# Patient Record
Sex: Female | Born: 1945 | Race: White | Hispanic: No | Marital: Married | Smoking: Former smoker
Health system: Southern US, Community
[De-identification: ages and names within clinical notes are randomized; demographics above are authoritative.]

## PROBLEM LIST (undated history)

## (undated) DIAGNOSIS — E785 Hyperlipidemia, unspecified: Secondary | ICD-10-CM

## (undated) DIAGNOSIS — R519 Headache, unspecified: Secondary | ICD-10-CM

## (undated) DIAGNOSIS — I10 Essential (primary) hypertension: Secondary | ICD-10-CM

## (undated) HISTORY — PX: FOOT SURGERY: SHX648

---

## 2007-03-18 ENCOUNTER — Encounter: Admission: RE | Admit: 2007-03-18 | Discharge: 2007-03-18 | Payer: Self-pay | Admitting: Specialist

## 2014-03-03 ENCOUNTER — Other Ambulatory Visit: Payer: Self-pay | Admitting: Adult Health

## 2014-03-03 ENCOUNTER — Ambulatory Visit (INDEPENDENT_AMBULATORY_CARE_PROVIDER_SITE_OTHER): Payer: Self-pay

## 2014-03-03 DIAGNOSIS — T1490XA Injury, unspecified, initial encounter: Secondary | ICD-10-CM

## 2014-03-03 DIAGNOSIS — M25529 Pain in unspecified elbow: Secondary | ICD-10-CM

## 2014-03-11 ENCOUNTER — Encounter: Payer: Self-pay | Admitting: Sports Medicine

## 2014-03-22 ENCOUNTER — Ambulatory Visit (INDEPENDENT_AMBULATORY_CARE_PROVIDER_SITE_OTHER): Payer: Worker's Compensation | Admitting: Sports Medicine

## 2014-03-22 ENCOUNTER — Encounter: Payer: Self-pay | Admitting: Sports Medicine

## 2014-03-22 VITALS — BP 114/70 | HR 74 | Ht 64.0 in | Wt 160.0 lb

## 2014-03-22 DIAGNOSIS — M25529 Pain in unspecified elbow: Secondary | ICD-10-CM

## 2014-03-22 DIAGNOSIS — M25521 Pain in right elbow: Secondary | ICD-10-CM | POA: Insufficient documentation

## 2014-03-22 MED ORDER — MELOXICAM 15 MG PO TABS: ORAL_TABLET | ORAL | Status: DC

## 2014-03-22 NOTE — Progress Notes (Signed)
  Workers comp  Date of injury: January 28, 2014 Employer: [employer]  Subjective:    I'm seeing this patient as a consultation for:  Laurance Flatten, NP  CC: Right elbow pain  HPI: Nearly 2 months ago this pleasant 68 year old female was lifting a box approximately 20 pounds over her head, she felt immediate pain both along the lateral and medial elbow. She had a burning sensation traveling to the forearm, both dorsally and on the volar forearm as well. Symptoms are moderate, persistent and they have not improved. Her arm feels significantly weak, she is post ulnar nerve transposition.  Past medical history, Surgical history, Family history not pertinant except as noted below, Social history, Allergies, and medications have been entered into the medical record, reviewed, and no changes needed.   Review of Systems: No headache, visual changes, nausea, vomiting, diarrhea, constipation, dizziness, abdominal pain, skin rash, fevers, chills, night sweats, weight loss, swollen lymph nodes, body aches, joint swelling, muscle aches, chest pain, shortness of breath, mood changes, visual or auditory hallucinations.   Objective:   General: Well Developed, well nourished, and in no acute distress.  Neuro/Psych: Alert and oriented x3, extra-ocular muscles intact, able to move all 4 extremities, sensation grossly intact. Skin: Warm and dry, no rashes noted.  Respiratory: Not using accessory muscles, speaking in full sentences, trachea midline.  Cardiovascular: Pulses palpable, no extremity edema. Abdomen: Does not appear distended. Right Elbow: Unremarkable to inspection. Range of motion full pronation, supination, flexion, extension. Stable to varus, valgus stress. Negative moving valgus stress test. To palpation of the medial and lateral epicondyles as well as along the flexor and extensor tendons. She is weak to pronation with reproduction of pain.. Noted scar from prior ulnar nerve  transposition. Negative cubital tunnel Tinel's.  X-rays reviewed and are negative.  Impression and Recommendations:   This case required medical decision making of moderate complexity.

## 2014-03-22 NOTE — Assessment & Plan Note (Signed)
Occurred January 28, 2014 at work. She does have symptoms that resemble both the medial and lateral epicondylitis with concern for biceps tendon injury. At this point we are going to proceed with an MRI of the right elbow, Mobic, formal physical therapy for both medial and lateral epicondylitis. Return to see me go over results of the MRI, she does desire this more immediate interventional treatment, I'm happy to do this if she gets no better by the time the MRI is done.

## 2014-04-04 ENCOUNTER — Telehealth: Payer: Self-pay | Admitting: *Deleted

## 2014-04-04 NOTE — Telephone Encounter (Signed)
Received VM from Dimitri Ped, gallegher bassett, Claim # [id number] that pt was scheduled for her MRI. Called pt to inform her to bring CD of MRI and she states they may try to get it scheduled with [facility].

## 2014-04-06 ENCOUNTER — Ambulatory Visit: Payer: Worker's Compensation | Admitting: Physical Therapy

## 2014-04-06 ENCOUNTER — Encounter: Payer: Self-pay | Admitting: Sports Medicine

## 2014-04-12 ENCOUNTER — Encounter: Payer: Self-pay | Admitting: Sports Medicine

## 2014-04-13 ENCOUNTER — Ambulatory Visit (INDEPENDENT_AMBULATORY_CARE_PROVIDER_SITE_OTHER): Payer: Worker's Compensation | Admitting: Physical Therapy

## 2014-04-13 DIAGNOSIS — M25529 Pain in unspecified elbow: Secondary | ICD-10-CM

## 2014-04-13 DIAGNOSIS — M2560 Stiffness of unspecified joint, not elsewhere classified: Secondary | ICD-10-CM

## 2014-04-13 DIAGNOSIS — M6281 Muscle weakness (generalized): Secondary | ICD-10-CM

## 2014-04-13 DIAGNOSIS — M25539 Pain in unspecified wrist: Secondary | ICD-10-CM

## 2014-04-21 ENCOUNTER — Encounter: Payer: Self-pay | Admitting: Physical Therapy

## 2014-04-25 ENCOUNTER — Encounter (INDEPENDENT_AMBULATORY_CARE_PROVIDER_SITE_OTHER): Payer: Worker's Compensation | Admitting: Physical Therapy

## 2014-04-25 DIAGNOSIS — M6281 Muscle weakness (generalized): Secondary | ICD-10-CM

## 2014-04-25 DIAGNOSIS — M25529 Pain in unspecified elbow: Secondary | ICD-10-CM

## 2014-04-25 DIAGNOSIS — M25539 Pain in unspecified wrist: Secondary | ICD-10-CM

## 2014-04-25 DIAGNOSIS — M2560 Stiffness of unspecified joint, not elsewhere classified: Secondary | ICD-10-CM

## 2014-04-28 ENCOUNTER — Encounter (INDEPENDENT_AMBULATORY_CARE_PROVIDER_SITE_OTHER): Payer: Worker's Compensation | Admitting: Physical Therapy

## 2014-04-28 DIAGNOSIS — M25539 Pain in unspecified wrist: Secondary | ICD-10-CM

## 2014-04-28 DIAGNOSIS — M25529 Pain in unspecified elbow: Secondary | ICD-10-CM

## 2014-04-28 DIAGNOSIS — M6281 Muscle weakness (generalized): Secondary | ICD-10-CM

## 2014-04-28 DIAGNOSIS — M2560 Stiffness of unspecified joint, not elsewhere classified: Secondary | ICD-10-CM

## 2014-05-09 ENCOUNTER — Encounter (INDEPENDENT_AMBULATORY_CARE_PROVIDER_SITE_OTHER): Payer: Worker's Compensation | Admitting: Physical Therapy

## 2014-05-09 DIAGNOSIS — M25529 Pain in unspecified elbow: Secondary | ICD-10-CM

## 2014-05-09 DIAGNOSIS — M2560 Stiffness of unspecified joint, not elsewhere classified: Secondary | ICD-10-CM

## 2014-05-09 DIAGNOSIS — M25539 Pain in unspecified wrist: Secondary | ICD-10-CM

## 2014-05-09 DIAGNOSIS — M6281 Muscle weakness (generalized): Secondary | ICD-10-CM

## 2014-05-12 ENCOUNTER — Encounter (INDEPENDENT_AMBULATORY_CARE_PROVIDER_SITE_OTHER): Payer: Worker's Compensation | Admitting: Physical Therapy

## 2014-05-12 DIAGNOSIS — M25539 Pain in unspecified wrist: Secondary | ICD-10-CM

## 2014-05-12 DIAGNOSIS — M6281 Muscle weakness (generalized): Secondary | ICD-10-CM

## 2014-05-12 DIAGNOSIS — M25529 Pain in unspecified elbow: Secondary | ICD-10-CM

## 2014-05-12 DIAGNOSIS — M2560 Stiffness of unspecified joint, not elsewhere classified: Secondary | ICD-10-CM

## 2014-05-16 ENCOUNTER — Encounter (INDEPENDENT_AMBULATORY_CARE_PROVIDER_SITE_OTHER): Payer: Worker's Compensation | Admitting: Physical Therapy

## 2014-05-16 DIAGNOSIS — M25529 Pain in unspecified elbow: Secondary | ICD-10-CM

## 2014-05-16 DIAGNOSIS — M2560 Stiffness of unspecified joint, not elsewhere classified: Secondary | ICD-10-CM

## 2014-05-16 DIAGNOSIS — M6281 Muscle weakness (generalized): Secondary | ICD-10-CM

## 2014-05-16 DIAGNOSIS — M25539 Pain in unspecified wrist: Secondary | ICD-10-CM

## 2014-05-19 ENCOUNTER — Encounter (INDEPENDENT_AMBULATORY_CARE_PROVIDER_SITE_OTHER): Payer: Worker's Compensation | Admitting: Physical Therapy

## 2014-05-19 DIAGNOSIS — M6281 Muscle weakness (generalized): Secondary | ICD-10-CM

## 2014-05-19 DIAGNOSIS — M25539 Pain in unspecified wrist: Secondary | ICD-10-CM

## 2014-05-19 DIAGNOSIS — M25529 Pain in unspecified elbow: Secondary | ICD-10-CM

## 2014-05-19 DIAGNOSIS — M2560 Stiffness of unspecified joint, not elsewhere classified: Secondary | ICD-10-CM

## 2014-05-26 ENCOUNTER — Encounter (INDEPENDENT_AMBULATORY_CARE_PROVIDER_SITE_OTHER): Payer: Worker's Compensation | Admitting: Physical Therapy

## 2014-05-26 DIAGNOSIS — M6281 Muscle weakness (generalized): Secondary | ICD-10-CM

## 2014-05-26 DIAGNOSIS — M2560 Stiffness of unspecified joint, not elsewhere classified: Secondary | ICD-10-CM

## 2014-05-26 DIAGNOSIS — M25539 Pain in unspecified wrist: Secondary | ICD-10-CM

## 2014-05-26 DIAGNOSIS — M25529 Pain in unspecified elbow: Secondary | ICD-10-CM

## 2014-06-16 ENCOUNTER — Encounter (INDEPENDENT_AMBULATORY_CARE_PROVIDER_SITE_OTHER): Payer: Worker's Compensation | Admitting: Physical Therapy

## 2014-06-16 DIAGNOSIS — M25529 Pain in unspecified elbow: Secondary | ICD-10-CM

## 2014-06-16 DIAGNOSIS — M6281 Muscle weakness (generalized): Secondary | ICD-10-CM

## 2014-06-16 DIAGNOSIS — M2560 Stiffness of unspecified joint, not elsewhere classified: Secondary | ICD-10-CM

## 2014-06-16 DIAGNOSIS — M25539 Pain in unspecified wrist: Secondary | ICD-10-CM

## 2014-06-17 ENCOUNTER — Telehealth: Payer: Self-pay

## 2014-06-17 NOTE — Telephone Encounter (Signed)
Received VM from Dimitri PedJoy Walker, gallegher bassett, Claim # [id number] asking if Dr Benjamin Stainhekkekandam has released patient Alyssa BettersJanet Hedtke. She did not leave any other information. I called back to get more information and had to leave a voice mail advising her to return call.

## 2014-06-17 NOTE — Telephone Encounter (Signed)
Not sure, patient has not returned since May. I would assume so considering her lack of followup.

## 2014-07-28 ENCOUNTER — Encounter: Payer: Self-pay | Admitting: Sports Medicine

## 2014-07-28 ENCOUNTER — Ambulatory Visit (INDEPENDENT_AMBULATORY_CARE_PROVIDER_SITE_OTHER): Payer: Worker's Compensation | Admitting: Sports Medicine

## 2014-07-28 VITALS — BP 115/73 | HR 74 | Ht 64.0 in | Wt 162.0 lb

## 2014-07-28 DIAGNOSIS — M25521 Pain in right elbow: Secondary | ICD-10-CM

## 2014-07-28 MED ORDER — TRAMADOL HCL 50 MG PO TABS: ORAL_TABLET | ORAL | Status: AC

## 2014-07-28 NOTE — Progress Notes (Signed)
  Subjective:    CC: Followup  HPI: I saw Alyssa Rice several months ago for right-sided elbow pain, she did have some medial and lateral epicondylar symptoms, we obtained an MRI that showed predominantly lateral epicondylitis. She has been undergoing physical therapy, and taking occasional tramadol and reports that her symptoms are well-controlled.  Past medical history, Surgical history, Family history not pertinant except as noted below, Social history, Allergies, and medications have been entered into the medical record, reviewed, and no changes needed.   Review of Systems: No fevers, chills, night sweats, weight loss, chest pain, or shortness of breath.   Objective:    General: Well Developed, well nourished, and in no acute distress.  Neuro: Alert and oriented x3, extra-ocular muscles intact, sensation grossly intact.  HEENT: Normocephalic, atraumatic, pupils equal round reactive to light, neck supple, no masses, no lymphadenopathy, thyroid nonpalpable.  Skin: Warm and dry, no rashes. Cardiac: Regular rate and rhythm, no murmurs rubs or gallops, no lower extremity edema.  Respiratory: Clear to auscultation bilaterally. Not using accessory muscles, speaking in full sentences. Right Elbow: Unremarkable to inspection. Range of motion full pronation, supination, flexion, extension. Strength is full to all of the above directions Stable to varus, valgus stress. Negative moving valgus stress test. No discrete areas of tenderness to palpation. Ulnar nerve does not sublux. Negative cubital tunnel Tinel's.  Impression and Recommendations:

## 2014-07-28 NOTE — Assessment & Plan Note (Signed)
MRI does show lateral epicondylitis. At this point she is doing well with an occasional tramadol. Refilling tramadol, if pain recurs, we can proceed with injection, if recurs after that we could certainly consider PRP. Return as needed.

## 2014-08-12 ENCOUNTER — Encounter: Payer: Self-pay | Admitting: Sports Medicine

## 2014-08-12 ENCOUNTER — Ambulatory Visit (INDEPENDENT_AMBULATORY_CARE_PROVIDER_SITE_OTHER): Payer: Worker's Compensation | Admitting: Sports Medicine

## 2014-08-12 VITALS — BP 136/83 | HR 85 | Wt 161.0 lb

## 2014-08-12 DIAGNOSIS — M25521 Pain in right elbow: Secondary | ICD-10-CM

## 2014-08-12 NOTE — Assessment & Plan Note (Signed)
No MRI did show a partial tear of the common extensor tendon origin, her pain is not referable to the lateral epicondyle today. Pain is however referable to the elbow joint. Injection placed into the joint as above. Return in one month.

## 2014-08-12 NOTE — Progress Notes (Signed)
  Subjective:    CC: Followup  HPI: Right elbow pain: Liborio NixonJanice has had right elbow problems for sometime now, we did have an MRI that showed what appeared to a tennis elbow. I placed her through conservative measures but unfortunately she continues to have pain. She is also post ulnar nerve transposition. Her pain today however is between the lateral epicondyle and olecranon over the anconeus muscle. She has no pain at the common extensor tendon origin. Symptoms are severe, persistent. She does desire interventional treatment today.  Past medical history, Surgical history, Family history not pertinant except as noted below, Social history, Allergies, and medications have been entered into the medical record, reviewed, and no changes needed.   Review of Systems: No fevers, chills, night sweats, weight loss, chest pain, or shortness of breath.   Objective:    General: Well Developed, well nourished, and in no acute distress.  Neuro: Alert and oriented x3, extra-ocular muscles intact, sensation grossly intact.  HEENT: Normocephalic, atraumatic, pupils equal round reactive to light, neck supple, no masses, no lymphadenopathy, thyroid nonpalpable.  Skin: Warm and dry, no rashes. Cardiac: Regular rate and rhythm, no murmurs rubs or gallops, no lower extremity edema.  Respiratory: Clear to auscultation bilaterally. Not using accessory muscles, speaking in full sentences. Right Elbow: Unremarkable to inspection. Range of motion full pronation, supination, flexion, extension. Strength is full to all of the above directions Stable to varus, valgus stress. Negative moving valgus stress test. No discrete tenderness to palpation over the common extensor tendon origin, she is tender to palpation over the anconeus muscle between the olecranon and lateral epicondyle. Ulnar nerve does not sublux. Negative cubital tunnel Tinel's.  Procedure: Real-time Ultrasound Guided Injection of right elbow joint Device:  GE Logiq E  Verbal informed consent obtained.  Time-out conducted.  Noted no overlying erythema, induration, or other signs of local infection.  Skin prepped in a sterile fashion.  Local anesthesia: Topical Ethyl chloride.  With sterile technique and under real time ultrasound guidance: 25-gauge needle advanced through the anconeus muscle between the olecranon and lateral epicondyle, a total of 1 cc Kenalog 40, 3 cc lidocaine injected easily into the elbow joint. Completed without difficulty  Pain immediately resolved suggesting accurate placement of the medication.  Advised to call if fevers/chills, erythema, induration, drainage, or persistent bleeding.  Images permanently stored and available for review in the ultrasound unit.  Impression: Technically successful ultrasound guided injection.  Impression and Recommendations:

## 2014-09-13 ENCOUNTER — Ambulatory Visit: Payer: Self-pay | Admitting: Sports Medicine

## 2020-09-09 ENCOUNTER — Encounter: Payer: Self-pay | Admitting: Emergency Medicine

## 2020-09-09 ENCOUNTER — Other Ambulatory Visit: Payer: Self-pay

## 2020-09-09 ENCOUNTER — Emergency Department
Admission: EM | Admit: 2020-09-09 | Discharge: 2020-09-09 | Disposition: A | Discharge status: Home / Self Care | Payer: Self-pay | Source: Home / Self Care

## 2020-09-09 DIAGNOSIS — Z973 Presence of spectacles and contact lenses: Secondary | ICD-10-CM

## 2020-09-09 DIAGNOSIS — S0502XA Injury of conjunctiva and corneal abrasion without foreign body, left eye, initial encounter: Secondary | ICD-10-CM | POA: Diagnosis not present

## 2020-09-09 HISTORY — DX: Headache, unspecified: R51.9

## 2020-09-09 HISTORY — DX: Essential (primary) hypertension: I10

## 2020-09-09 HISTORY — DX: Hyperlipidemia, unspecified: E78.5

## 2020-09-09 MED ORDER — GENTAMICIN SULFATE 0.3 % OP SOLN: 1.0000 [drp] | Freq: Four times a day (QID) | OPHTHALMIC | 0 refills | Status: AC

## 2020-09-09 NOTE — Discharge Instructions (Signed)
  Avoid wearing your contacts until pain has resolved. Then start with a new pair of contacts in case there is a small defect in your lens that caused the scratch. Use the resource guide to call and schedule a follow up appointment with an eye specialist this week if not improving in 2-3 days.

## 2020-09-09 NOTE — ED Triage Notes (Signed)
Patient awoke this morning with pain and redness in left eye. Wears contacts but had taken them out last night. Tried eye rinse. Has had covid twice and also has been vaccinated.

## 2020-09-09 NOTE — ED Provider Notes (Signed)
[facility]    CSN: [id number] Arrival date & time: 09/09/20  1543      History   Chief Complaint Chief Complaint  Patient presents with  . Eye Problem    HPI Alyssa Rice is a 74 y.o. female.   HPI Alyssa Rice is a 74 y.o. female presenting to UC with c/o Left eye pain and irritation that started this morning when she woke up.  She wears contacts but takes them out before bed.  Denies injury to the eye. She has tried flushing her eye without relief. No change in vision. No recent URI symptoms.    Past Medical History:  Diagnosis Date  . Headache disorder   . Hyperlipidemia   . Hypertension     Patient Active Problem List   Diagnosis Date Noted  . Right elbow pain 03/22/2014    History reviewed. No pertinent surgical history.  OB History   No obstetric history on file.      Home Medications    Prior to Admission medications   Medication Sig Start Date End Date Taking? Authorizing Provider  atorvastatin (LIPITOR) 80 MG tablet Take 80 mg by mouth daily.   Yes [provider]  estradiol (ESTRACE) 1 MG tablet Take 1 mg by mouth daily.    [provider]  gentamicin (GARAMYCIN) 0.3 % ophthalmic solution Place 1 drop into the left eye 4 (four) times daily for 5 days. 09/09/20 09/14/20  Lurene Shadow, PA-C  hydrochlorothiazide (HYDRODIURIL) 25 MG tablet Take 25 mg by mouth daily.    [provider]  losartan (COZAAR) 50 MG tablet Take 50 mg by mouth daily.    [provider]  meloxicam (MOBIC) 15 MG tablet One tab PO qAM with breakfast for 2 weeks, then daily prn pain. 03/22/14   Monica Becton, MD  traMADol (ULTRAM) 50 MG tablet 1-2 tabs by mouth Q8 hours, maximum 6 tabs per day. 07/28/14   Monica Becton, MD    Family History No family history on file.  Social History Social History   Tobacco Use  . Smoking status: Never Smoker  Substance Use Topics  . Alcohol use: Not on file  . Drug use: Not  on file     Allergies   Patient has no known allergies.   Review of Systems Review of Systems  HENT: Negative for congestion, ear pain and facial swelling.   Eyes: Positive for pain, discharge (watery) and redness. Negative for photophobia and visual disturbance.  Neurological: Negative for dizziness and headaches.     Physical Exam Triage Vital Signs ED Triage Vitals  Enc Vitals Group     BP 09/09/20 1602 (!) 162/94     Pulse Rate 09/09/20 1602 74     Resp 09/09/20 1602 18     Temp 09/09/20 1602 98 F (36.7 C)     Temp Source 09/09/20 1602 Oral     SpO2 09/09/20 1602 98 %     Weight 09/09/20 1603 154 lb (69.9 kg)     Height 09/09/20 1603 5\' 4"  (1.626 m)     Head Circumference --      Peak Flow --      Pain Score 09/09/20 1603 9     Pain Loc --      Pain Edu? --      Excl. in GC? --    No data found.  Updated Vital Signs BP (!) 162/94 (BP Location: Right Arm)   Pulse 74  Temp 98 F (36.7 C) (Oral)   Resp 18   Ht 5\' 4"  (1.626 m)   Wt 154 lb (69.9 kg)   SpO2 98%   BMI 26.43 kg/m   Visual Acuity Right Eye Distance: 20/20 Left Eye Distance: 20/40 Bilateral Distance: with correction glasses  Right Eye Near:   Left Eye Near:    Bilateral Near:     Physical Exam Vitals and nursing note reviewed.  Constitutional:      Appearance: Normal appearance. She is well-developed.  HENT:     Head: Normocephalic and atraumatic.     Right Ear: Tympanic membrane and ear canal normal.     Left Ear: Tympanic membrane and ear canal normal.     Nose: Nose normal.     Right Sinus: No maxillary sinus tenderness or frontal sinus tenderness.     Left Sinus: No maxillary sinus tenderness or frontal sinus tenderness.     Mouth/Throat:     Lips: Pink.     Mouth: Mucous membranes are moist.     Pharynx: Oropharynx is clear. Uvula midline.  Eyes:     General: Lids are normal. Lids are everted, no foreign bodies appreciated. Vision grossly intact. Gaze aligned appropriately.         Left eye: No foreign body, discharge or hordeolum.     Extraocular Movements: Extraocular movements intact.     Conjunctiva/sclera: Conjunctivae normal.   Cardiovascular:     Rate and Rhythm: Normal rate.  Pulmonary:     Effort: Pulmonary effort is normal.  Musculoskeletal:        General: Normal range of motion.     Cervical back: Normal range of motion.  Skin:    General: Skin is warm and dry.  Neurological:     Mental Status: She is alert and oriented to person, place, and time.  Psychiatric:        Behavior: Behavior normal.      UC Treatments / Results  Labs (all labs ordered are listed, but only abnormal results are displayed) Labs Reviewed - No data to display  EKG   Radiology No results found.  Procedures Procedures (including critical care time)  Medications Ordered in UC Medications - No data to display  Initial Impression / Assessment and Plan / UC Course  I have reviewed the triage vital signs and the nursing notes.  Pertinent labs & imaging results that were available during my care of the patient were reviewed by me and considered in my medical decision making (see chart for details).     Hx and exam c/w corneal abrasion Rx: gentamicin drops F/u with optometry next week if not improving AVS given  Final Clinical Impressions(s) / UC Diagnoses   Final diagnoses:  Abrasion of left cornea, initial encounter  Uses contact lenses     Discharge Instructions      Avoid wearing your contacts until pain has resolved. Then start with a new pair of contacts in case there is a small defect in your lens that caused the scratch. Use the resource guide to call and schedule a follow up appointment with an eye specialist this week if not improving in 2-3 days.     ED Prescriptions    Medication Sig Dispense Auth. Provider   gentamicin (GARAMYCIN) 0.3 % ophthalmic solution Place 1 drop into the left eye 4 (four) times daily for 5 days. 5 mL  , PA-C     PDMP not reviewed this encounter.  Lurene Shadow, [place] 09/10/20 [phone]

## 2021-11-14 ENCOUNTER — Emergency Department (INDEPENDENT_AMBULATORY_CARE_PROVIDER_SITE_OTHER)
Admission: EM | Admit: 2021-11-14 | Discharge: 2021-11-14 | Disposition: A | Discharge status: Home / Self Care | Payer: Medicare Other | Source: Home / Self Care

## 2021-11-14 ENCOUNTER — Other Ambulatory Visit: Payer: Self-pay

## 2021-11-14 ENCOUNTER — Emergency Department (INDEPENDENT_AMBULATORY_CARE_PROVIDER_SITE_OTHER): Payer: Medicare Other

## 2021-11-14 DIAGNOSIS — M25561 Pain in right knee: Secondary | ICD-10-CM | POA: Diagnosis not present

## 2021-11-14 MED ORDER — BACLOFEN 10 MG PO TABS: 10.0000 mg | ORAL_TABLET | Freq: Three times a day (TID) | ORAL | 0 refills | Status: AC

## 2021-11-14 MED ORDER — PREDNISONE 20 MG PO TABS: ORAL_TABLET | ORAL | 0 refills | Status: DC

## 2021-11-14 NOTE — Discharge Instructions (Addendum)
Advised/informed patient of right knee x-ray results this evening.  Advised patient to take medication with food as directed.  Advised patient to start Prednisone burst tomorrow morning, Thursday, 11/15/2021.  Advised may take Baclofen daily or as needed.

## 2021-11-14 NOTE — ED Provider Notes (Signed)
[facility]    CSN: [id number] Arrival date & time: 11/14/21  1739      History   Chief Complaint Chief Complaint  Patient presents with   Knee Pain    Right knee pain and behind the knee. X3 days    HPI Alyssa Rice is a 76 y.o. female.   HPI 76 year old female presents that she has right knee pain for 3 days including pain behind right knee.  PMH significant for fibromyalgia and CKD stage III per patient as this is not listed in her PMH.  Reports that she takes Tramadol 50 to 150 mg daily for fibromyalgia and this medication has not helped with her current right knee pain.  Patient reports she is no longer taking Mobic due to gastric ulcer caused by this medication.  Past Medical History:  Diagnosis Date   Headache disorder    Hyperlipidemia    Hypertension     Patient Active Problem List   Diagnosis Date Noted   Right elbow pain 03/22/2014    Past Surgical History:  Procedure Laterality Date   FOOT SURGERY Left     OB History   No obstetric history on file.      Home Medications    Prior to Admission medications   Medication Sig Start Date End Date Taking? Authorizing Provider  atorvastatin (LIPITOR) 80 MG tablet Take 80 mg by mouth daily.   Yes [provider]  baclofen (LIORESAL) 10 MG tablet Take 1 tablet (10 mg total) by mouth 3 (three) times daily. 11/14/21  Yes Trevor Iha, FNP  estradiol (ESTRACE) 1 MG tablet Take 1 mg by mouth daily.   Yes [provider]  hydrochlorothiazide (HYDRODIURIL) 25 MG tablet Take 25 mg by mouth daily.   Yes [provider]  losartan (COZAAR) 50 MG tablet Take 50 mg by mouth daily.   Yes [provider]  predniSONE (DELTASONE) 20 MG tablet Take 2 tabs PO daily x 5 days. 11/14/21  Yes Trevor Iha, FNP  traMADol (ULTRAM) 50 MG tablet 1-2 tabs by mouth Q8 hours, maximum 6 tabs per day. 07/28/14  Yes Monica Becton, MD  meloxicam (MOBIC) 15 MG tablet One tab PO qAM with  breakfast for 2 weeks, then daily prn pain. 03/22/14   Monica Becton, MD  metoprolol succinate (TOPROL-XL) 25 MG 24 hr tablet Take 25 mg by mouth daily. 11/07/21   [provider]    Family History History reviewed. No pertinent family history.  Social History Social History   Tobacco Use   Smoking status: Never     Allergies   Gabapentin and Pregabalin   Review of Systems Review of Systems  Musculoskeletal:        Right knee pain x 3 days    Physical Exam Triage Vital Signs ED Triage Vitals  Enc Vitals Group     BP 11/14/21 1756 (!) 143/83     Pulse Rate 11/14/21 1756 68     Resp 11/14/21 1756 18     Temp 11/14/21 1756 97.9 F (36.6 C)     Temp Source 11/14/21 1756 Oral     SpO2 11/14/21 1756 98 %     Weight 11/14/21 1751 149 lb (67.6 kg)     Height 11/14/21 1751 5\' 4"  (1.626 m)     Head Circumference --      Peak Flow --      Pain Score 11/14/21 1750 6     Pain Loc --  Pain Edu? --      Excl. in GC? --    No data found.  Updated Vital Signs BP (!) 143/83 (BP Location: Right Arm)    Pulse 68    Temp 97.9 F (36.6 C) (Oral)    Resp 18    Ht 5\' 4"  (1.626 m)    Wt 149 lb (67.6 kg)    SpO2 98%    BMI 25.58 kg/m       Physical Exam Vitals and nursing note reviewed.  Constitutional:      Appearance: Normal appearance. She is normal weight.  HENT:     Head: Normocephalic and atraumatic.     Mouth/Throat:     Mouth: Mucous membranes are moist.     Pharynx: Oropharynx is clear.  Eyes:     Extraocular Movements: Extraocular movements intact.     Conjunctiva/sclera: Conjunctivae normal.     Pupils: Pupils are equal, round, and reactive to light.  Cardiovascular:     Rate and Rhythm: Normal rate and regular rhythm.     Pulses: Normal pulses.     Heart sounds: Normal heart sounds.  Pulmonary:     Effort: Pulmonary effort is normal.     Breath sounds: Normal breath sounds.  Musculoskeletal:     Comments: Right knee: TTP over  anterior/posterior surface of knee, positive Lachman's, exam limited due to pain and limited range of motion this evening  Skin:    General: Skin is warm and dry.  Neurological:     General: No focal deficit present.     Mental Status: She is alert and oriented to person, place, and time.     UC Treatments / Results  Labs (all labs ordered are listed, but only abnormal results are displayed) Labs Reviewed - No data to display  EKG   Radiology DG Knee Complete 4 Views Right  Result Date: 11/14/2021 CLINICAL DATA:  Right knee pain. EXAM: RIGHT KNEE - COMPLETE 4+ VIEW COMPARISON:  None. FINDINGS: No evidence of fracture, dislocation, or joint effusion. No evidence of arthropathy or other focal bone abnormality. Soft tissues are unremarkable. IMPRESSION: Negative. Electronically Signed   By: 11/16/2021 M.D.   On: 11/14/2021 18:25    Procedures Procedures (including critical care time)  Medications Ordered in UC Medications - No data to display  Initial Impression / Assessment and Plan / UC Course  I have reviewed the triage vital signs and the nursing notes.  Pertinent labs & imaging results that were available during my care of the patient were reviewed by me and considered in my medical decision making (see chart for details).     MDM: 1.  Right knee pain-knee x-ray was negative for acute osseous process Rx'd Prednisone and Baclofen. Advised patient to take medication with food as directed.  Advised patient to start Prednisone burst tomorrow morning, Thursday, 11/15/2021.  Advised may take Baclofen daily or as needed.  Patient discharged home, hemodynamically stable. Final Clinical Impressions(s) / UC Diagnoses   Final diagnoses:  Acute pain of right knee     Discharge Instructions      Advised/informed patient of right knee x-ray results this evening.  Advised patient to take medication with food as directed.  Advised patient to start Prednisone burst tomorrow morning,  Thursday, 11/15/2021.  Advised may take Baclofen daily or as needed.     ED Prescriptions     Medication Sig Dispense Auth. Provider   predniSONE (DELTASONE) 20 MG tablet Take 2 tabs  PO daily x 5 days. 15 tablet Trevor Ihaagan, Jabrea Kallstrom, FNP   baclofen (LIORESAL) 10 MG tablet Take 1 tablet (10 mg total) by mouth 3 (three) times daily. 30 each Trevor Ihaagan, Taym Twist, FNP      PDMP not reviewed this encounter.   Trevor IhaRagan, Carnella Fryman, FNP 11/14/21 1915

## 2021-11-14 NOTE — ED Triage Notes (Signed)
Pt states that she has some right knee pain. Pt states that she also has some pain behind her right knee. X3 days   Pt states that she fell on her knee a few years ago.

## 2022-07-21 ENCOUNTER — Ambulatory Visit
Admission: EM | Admit: 2022-07-21 | Discharge: 2022-07-21 | Disposition: A | Discharge status: Home / Self Care | Payer: Medicare Other | Attending: Emergency Medicine | Admitting: Emergency Medicine

## 2022-07-21 ENCOUNTER — Other Ambulatory Visit: Payer: Self-pay

## 2022-07-21 DIAGNOSIS — M1811 Unilateral primary osteoarthritis of first carpometacarpal joint, right hand: Secondary | ICD-10-CM

## 2022-07-21 MED ORDER — PREDNISONE 20 MG PO TABS: 40.0000 mg | ORAL_TABLET | Freq: Every day | ORAL | 0 refills | Status: AC

## 2022-07-21 NOTE — Discharge Instructions (Addendum)
Try Voltaren gel.  I am hesitant to give you the Mobic because your kidneys are not working as well as they should based on your recent lab work.  Continue taking at 1000 mg of Tylenol 3-4 times a day.  Finish the prednisone, even if you feel better.  Follow-up with any one of the hand surgeons at [facility] if your symptoms persist

## 2022-07-21 NOTE — ED Provider Notes (Signed)
HPI  SUBJECTIVE:  Alyssa Rice is a right-handed 76 y.o. female who presents with constant, severe right CMC joint pain, swelling for the past 2 days.  States that she is unable to use her thumb without significant pain.  No erythema, fevers, trauma to the area, overuse, distal numbness or tingling, limitation of motion of her thumb.  She states that she lifted a heavy tray of jewelry prior to the symptoms starting, but was using both hands.  She tried Tylenol arthritis, lidocaine patch, Ace wrap, thumb brace and heating pad.  No alleviating factors.  Symptoms are worse when she wears the thumb splint.  She states this is similar to previous arthritis flares.  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  PCP: [facility].   Past Medical History:  Diagnosis Date   Headache disorder    Hyperlipidemia    Hypertension     Past Surgical History:  Procedure Laterality Date   FOOT SURGERY Left     Family History  Problem Relation Age of Onset   Cancer Mother    Heart disease Mother    Cancer Father     Social History   Tobacco Use   Smoking status: Former    Types: Cigarettes   Smokeless tobacco: Never  Vaping Use   Vaping Use: Never used  Substance Use Topics   Alcohol use: Not Currently   Drug use: Not Currently    No current facility-administered medications for this encounter.  Current Outpatient Medications:    acetaminophen (TYLENOL) 650 MG CR tablet, Take 650 mg by mouth every 8 (eight) hours as needed for pain., Disp: , Rfl:    predniSONE (DELTASONE) 20 MG tablet, Take 2 tablets (40 mg total) by mouth daily with breakfast for 5 days., Disp: 10 tablet, Rfl: 0   atorvastatin (LIPITOR) 80 MG tablet, Take 80 mg by mouth daily., Disp: , Rfl:    baclofen (LIORESAL) 10 MG tablet, Take 1 tablet (10 mg total) by mouth 3 (three) times daily., Disp: 30 each, Rfl: 0   estradiol (ESTRACE) 1 MG tablet, Take 1 mg by mouth daily., Disp: , Rfl:     hydrochlorothiazide (HYDRODIURIL) 25 MG tablet, Take 25 mg by mouth daily., Disp: , Rfl:    losartan (COZAAR) 50 MG tablet, Take 50 mg by mouth daily., Disp: , Rfl:    metoprolol succinate (TOPROL-XL) 25 MG 24 hr tablet, Take 25 mg by mouth daily., Disp: , Rfl:    traMADol (ULTRAM) 50 MG tablet, 1-2 tabs by mouth Q8 hours, maximum 6 tabs per day., Disp: 90 tablet, Rfl: 0  Allergies  Allergen Reactions   Gabapentin Other (See Comments)    dizziness   Pregabalin      ROS  As noted in HPI.   Physical Exam  BP 117/76 (BP Location: Right Arm)   Pulse 68   Temp 99 F (37.2 C) (Oral)   Resp 20   Ht 5' 3.5" (1.613 m)   Wt 68 kg   SpO2 97%   BMI 26.15 kg/m   Constitutional: Well developed, well nourished, no acute distress Eyes:  EOMI, conjunctiva normal bilaterally HENT: Normocephalic, atraumatic,mucus membranes moist Respiratory: Normal inspiratory effort Cardiovascular: Normal rate GI: nondistended skin: No rash, skin intact Musculoskeletal: Tenderness, bogginess, swelling at the right [facility] joint.  No tenderness at the IP or DIP of the thumb.  No overlying erythema.  Patient able to oppose her thumb to index and little finger.  Cap refill less than 2  seconds.  RP 2+.  Normal light touch intact.  Patient able to flex/extend thumb.  Skin intact. No signs of trauma.  The rest of the hand and wrist WNL.   Neurologic: Alert & oriented x 3, no focal neuro deficits Psychiatric: Speech and behavior appropriate   ED Course   Medications - No data to display  No orders of the defined types were placed in this encounter.   No results found for this or any previous visit (from the past 24 hour(s)). No results found.  ED Clinical Impression  1. Arthritis of carpometacarpal [facility]) joint of right thumb      ED Assessment/Plan    Outside labs reviewed. Calculated creatinine clearance from outside labs done on 05/02/2022 36 mL/min.  I am hesitant to send home with oral NSAIDs  due to the decreased kidney function.  However, I think Voltaren gel will be a reasonable alternative.  Home with prednisone 40 mg for 5 days.  Follow-up with anyone of the hand surgeons at [facility] if still having issues after finishing the steroids.  After discussing the possibility of doing an x-ray with the patient, we agreed to defer this in the absence of trauma.  Discussed labs, MDM, treatment plan, and plan for follow-up with patient. patient agrees with plan.   Meds ordered this encounter  Medications   predniSONE (DELTASONE) 20 MG tablet    Sig: Take 2 tablets (40 mg total) by mouth daily with breakfast for 5 days.    Dispense:  10 tablet    Refill:  0      *This clinic note was created using Scientist, clinical (histocompatibility and immunogenetics). Therefore, there may be occasional mistakes despite careful proofreading.  ?    Domenick Gong, MD 07/22/22 [phone]

## 2022-07-21 NOTE — ED Triage Notes (Signed)
Pt presents to Urgent Care with c/o R thumb pain x 2 days. Limited ROM. Has been diagnosed with arthritis in both thumbs.

## 2023-04-05 IMAGING — DX DG KNEE COMPLETE 4+V*R*
4 series · 4 of 4 positions shown · non-contrast
Comparison: None.

CLINICAL DATA: Right knee pain.

EXAM:
RIGHT KNEE - COMPLETE 4+ VIEW

[knee ap]
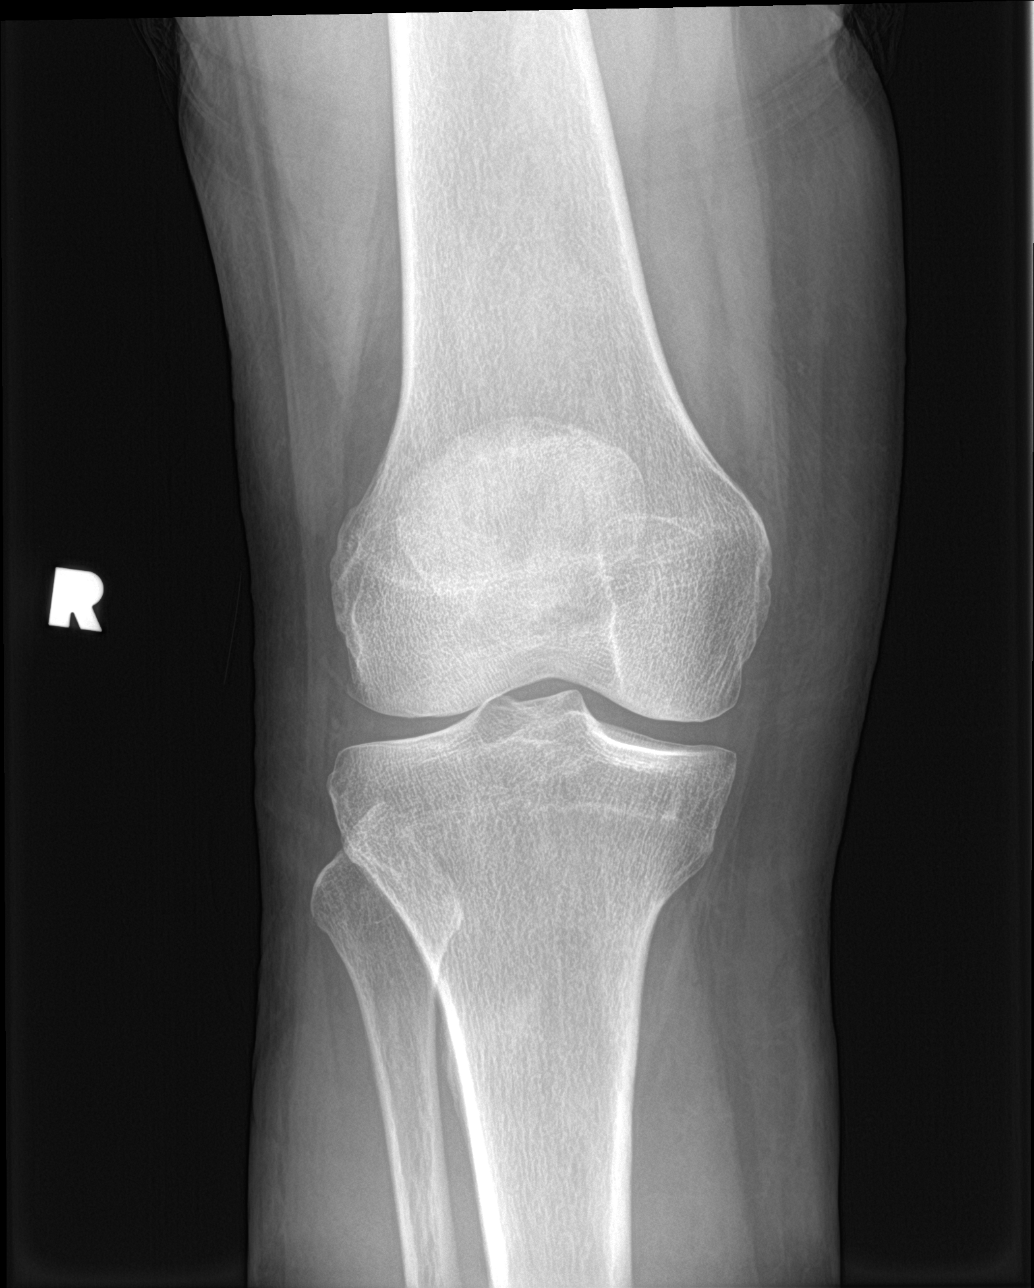

[knee lat]
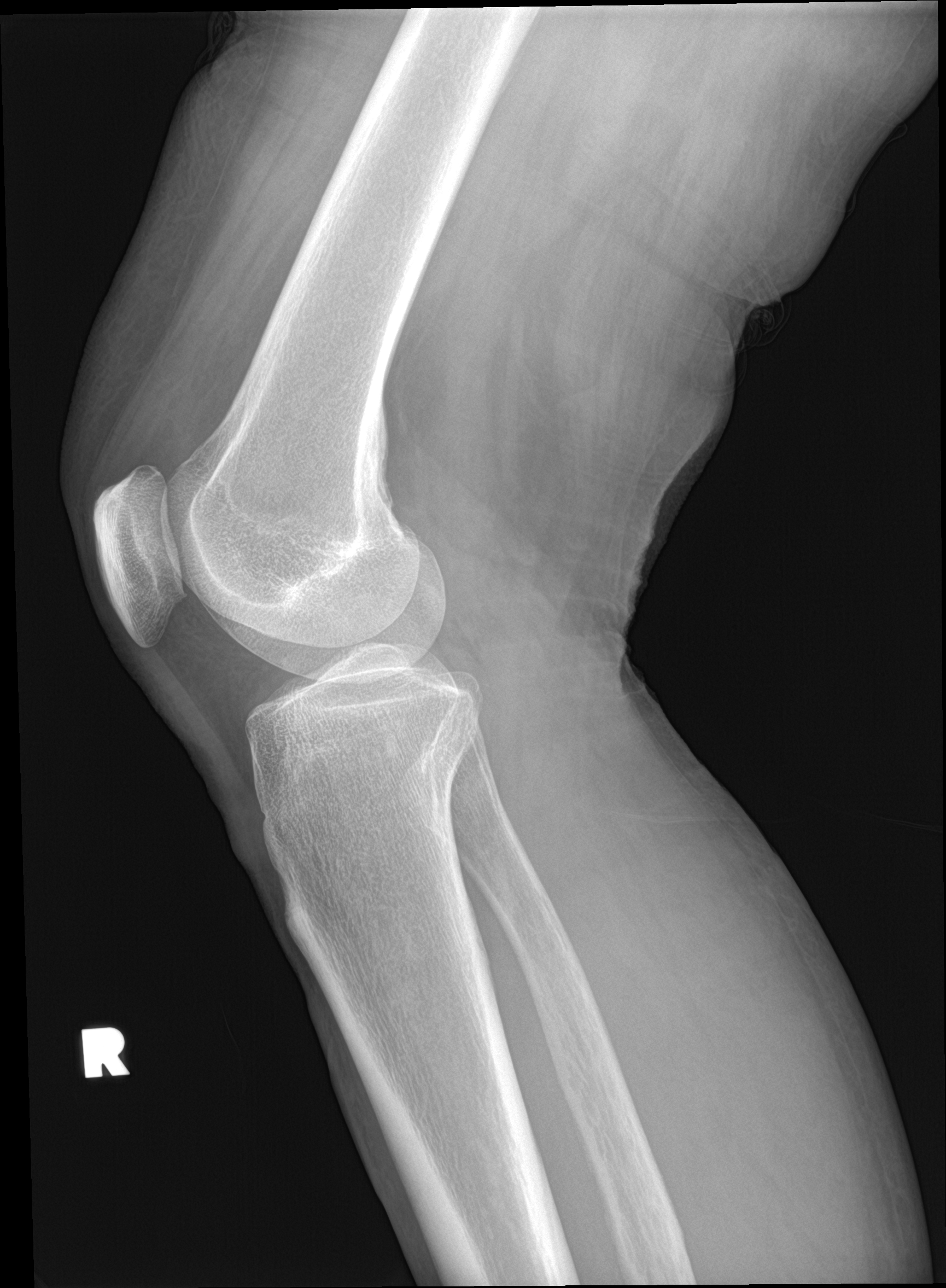

[knee obl (1 of 2)]
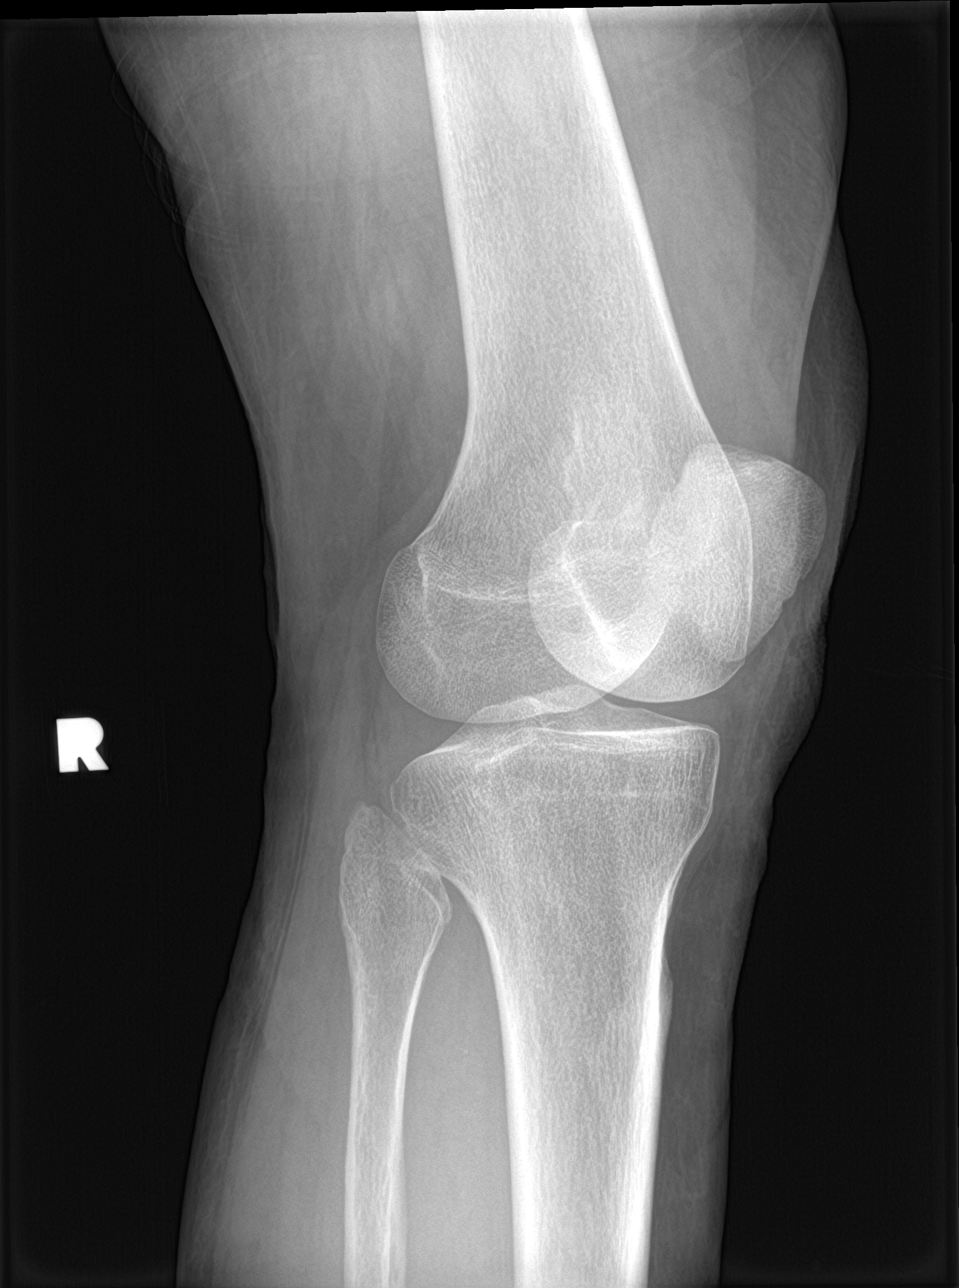

[knee obl (2 of 2)]
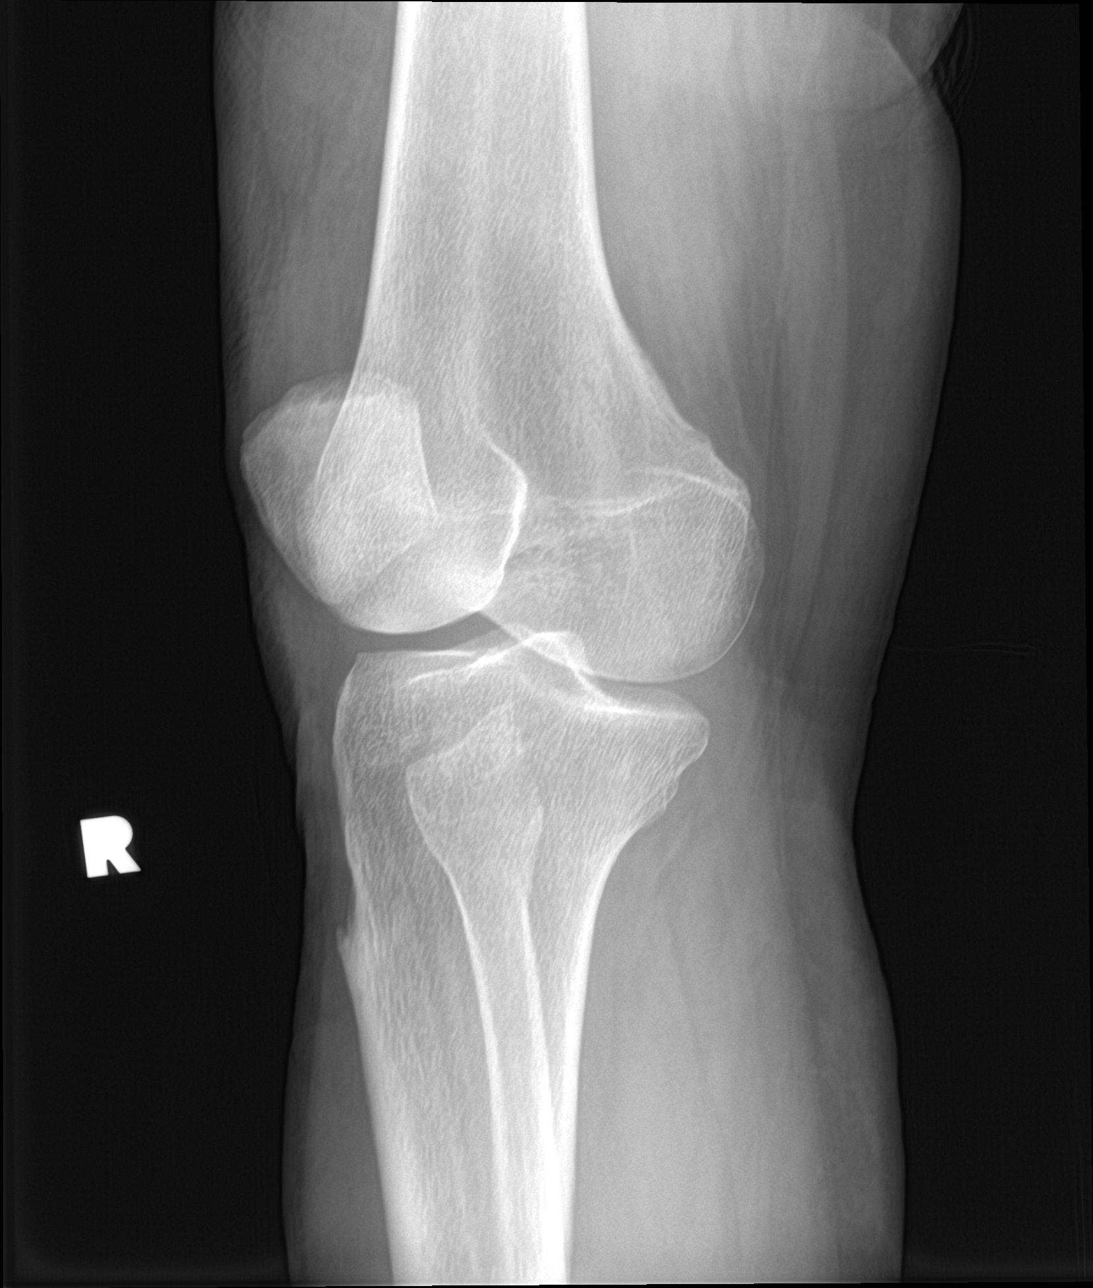

[4 of 4 positions shown; findings below may reference images not displayed]

FINDINGS: No evidence of fracture, dislocation, or joint effusion. No evidence
of arthropathy or other focal bone abnormality. Soft tissues are
unremarkable.
IMPRESSION: Negative.

## 2024-02-17 ENCOUNTER — Ambulatory Visit (INDEPENDENT_AMBULATORY_CARE_PROVIDER_SITE_OTHER)

## 2024-02-17 ENCOUNTER — Ambulatory Visit
Admission: EM | Admit: 2024-02-17 | Discharge: 2024-02-17 | Disposition: A | Discharge status: Home / Self Care | Attending: Family Medicine | Admitting: Family Medicine

## 2024-02-17 DIAGNOSIS — J4521 Mild intermittent asthma with (acute) exacerbation: Secondary | ICD-10-CM

## 2024-02-17 DIAGNOSIS — R059 Cough, unspecified: Secondary | ICD-10-CM

## 2024-02-17 DIAGNOSIS — J3089 Other allergic rhinitis: Secondary | ICD-10-CM

## 2024-02-17 DIAGNOSIS — J209 Acute bronchitis, unspecified: Secondary | ICD-10-CM | POA: Diagnosis not present

## 2024-02-17 MED ORDER — METHYLPREDNISOLONE SODIUM SUCC 125 MG IJ SOLR
80.0000 mg | Freq: Once | INTRAMUSCULAR | Status: AC
  Administered 2024-02-17: 80 mg via INTRAMUSCULAR

## 2024-02-17 MED ORDER — DOXYCYCLINE HYCLATE 100 MG PO CAPS: 100.0000 mg | ORAL_CAPSULE | Freq: Two times a day (BID) | ORAL | 0 refills | Status: AC

## 2024-02-17 MED ORDER — BENZONATATE 200 MG PO CAPS: 200.0000 mg | ORAL_CAPSULE | Freq: Two times a day (BID) | ORAL | 0 refills | Status: AC | PRN

## 2024-02-17 MED ORDER — PREDNISONE 20 MG PO TABS: ORAL_TABLET | ORAL | 0 refills | Status: AC

## 2024-02-17 MED ORDER — IPRATROPIUM-ALBUTEROL 0.5-2.5 (3) MG/3ML IN SOLN
3.0000 mL | Freq: Once | RESPIRATORY_TRACT | Status: AC
  Administered 2024-02-17: 3 mL via RESPIRATORY_TRACT

## 2024-02-17 NOTE — ED Provider Notes (Signed)
 [facility]    CSN: [id number] Arrival date & time: 02/17/24  1641      History   Chief Complaint Chief Complaint  Patient presents with   Cough   Dizziness   Shortness of Breath    HPI Alyssa Rice is a 78 y.o. female.   HPI Patient is a prior smoker She has known seasonal environmental allergies and usually gets allergy shots This year she decided not to get allergy shots and has had more difficulty with her allergies.  She has had a lot of runny nose.  Sinus congestion.  Postnasal drip.  Cough and shortness of breath. She has had worsening shortness of breath over the last couple of weeks.  With coughing spells she sometimes feels dizzy She does not have any inhalers She uses a steroid nasal spray She denies fever and chills.  Denies sputum production.  Her chest hurts from the coughing Past Medical History:  Diagnosis Date   Headache disorder    Hyperlipidemia    Hypertension     Patient Active Problem List   Diagnosis Date Noted   Right elbow pain 03/22/2014  Patient Active Problem List : Copied from patient's primary care note Diagnosis  Allergic rhinitis  Constipation  HTN (hypertension)  Hyperlipidemia  Fibromyalgia  Atrial tachycardia, paroxysmal (*)  Menopausal symptoms  Vitamin D deficiency  Chronic prescription opiate use  IFG (impaired fasting glucose)  Chronic kidney disease (CKD), stage III (moderate) (*)   Past Surgical History:  Procedure Laterality Date   FOOT SURGERY Left     OB History   No obstetric history on file.      Home Medications    Prior to Admission medications   Medication Sig Start Date End Date Taking? Authorizing Provider  benzonatate  (TESSALON ) 200 MG capsule Take 1 capsule (200 mg total) by mouth 2 (two) times daily as needed for cough. 02/17/24  Yes Stephany Ehrich, MD  doxycycline  (VIBRAMYCIN ) 100 MG capsule Take 1 capsule (100 mg total) by mouth 2 (two) times daily. 02/17/24  Yes Stephany Ehrich, MD  meclizine (ANTIVERT) 12.5 MG tablet Take 12.5 mg by mouth 3 (three) times daily as needed. 01/30/24  Yes [provider]  predniSONE  (DELTASONE ) 20 MG tablet Take 2 pills a day for 5 days, then 1 pill a day for 5 days, then stop 02/17/24  Yes Stephany Ehrich, MD  acetaminophen (TYLENOL) 650 MG CR tablet Take 650 mg by mouth every 8 (eight) hours as needed for pain.    [provider]  atorvastatin (LIPITOR) 80 MG tablet Take 80 mg by mouth daily.    [provider]  baclofen  (LIORESAL ) 10 MG tablet Take 1 tablet (10 mg total) by mouth 3 (three) times daily. 11/14/21   Leonides Ramp, FNP  estradiol (ESTRACE) 1 MG tablet Take 1 mg by mouth daily.    [provider]  hydrochlorothiazide (HYDRODIURIL) 25 MG tablet Take 25 mg by mouth daily.    [provider]  losartan (COZAAR) 50 MG tablet Take 50 mg by mouth daily.    [provider]  metoprolol succinate (TOPROL-XL) 25 MG 24 hr tablet Take 25 mg by mouth daily. 11/07/21   [provider]  traMADol  (ULTRAM ) 50 MG tablet 1-2 tabs by mouth Q8 hours, maximum 6 tabs per day. 07/28/14   Gean Keels, MD    Family History Family History  Problem Relation Age of Onset   Cancer Mother  Heart disease Mother    Cancer Father     Social History Social History   Tobacco Use   Smoking status: Former    Types: Cigarettes   Smokeless tobacco: Never  Vaping Use   Vaping status: Never Used  Substance Use Topics   Alcohol use: Not Currently   Drug use: Not Currently     Allergies   Gabapentin and Pregabalin   Review of Systems Review of Systems See HPI  Physical Exam Triage Vital Signs ED Triage Vitals  Encounter Vitals Group     BP --      Systolic BP Percentile --      Diastolic BP Percentile --      Pulse Rate 02/17/24 1654 88     Resp 02/17/24 1654 17     Temp 02/17/24 1654 98.4 F (36.9 C)     Temp Source 02/17/24 1654 Oral     SpO2 02/17/24  1654 95 %     Weight --      Height --      Head Circumference --      Peak Flow --      Pain Score 02/17/24 1651 0     Pain Loc --      Pain Education --      Exclude from Growth Chart --    No data found.  Updated Vital Signs BP 127/83   Pulse 88   Temp 98.4 F (36.9 C) (Oral)   Resp 17   SpO2 95%       Physical Exam Constitutional:      General: She is not in acute distress.    Appearance: She is well-developed and normal weight. She is ill-appearing.  HENT:     Head: Normocephalic and atraumatic.  Eyes:     Conjunctiva/sclera: Conjunctivae normal.     Pupils: Pupils are equal, round, and reactive to light.  Cardiovascular:     Rate and Rhythm: Normal rate and regular rhythm.     Heart sounds: Normal heart sounds.  Pulmonary:     Effort: Pulmonary effort is normal. Tachypnea present. No respiratory distress.     Breath sounds: Decreased breath sounds and wheezing present.  Abdominal:     General: There is no distension.     Palpations: Abdomen is soft.  Musculoskeletal:        General: Normal range of motion.     Cervical back: Normal range of motion.  Skin:    General: Skin is warm and dry.  Neurological:     Mental Status: She is alert.      UC Treatments / Results    Radiology DG Chest 2 View Result Date: 02/17/2024 CLINICAL DATA:  Cough for 3 weeks. EXAM: CHEST - 2 VIEW COMPARISON:  None Available. FINDINGS: The heart size and mediastinal contours are within normal limits. Both lungs are clear. The visualized skeletal structures are unremarkable. IMPRESSION: No active cardiopulmonary disease. Electronically Signed   By: Rosalene Colon M.D.   On: 02/17/2024 17:52    Procedures  On initial lung evaluation patient has decreased breath sounds, tachypnea, few scattered wheeze She was given albuterol  treatment and Medrol  Reexamination after chest x-ray reveals much improvement.  Few scattered wheeze.  Few rhonchi.  Medications Ordered in  UC Medications  methylPREDNISolone  sodium succinate (SOLU-MEDROL ) 125 mg/2 mL injection 80 mg (80 mg Intramuscular Given 02/17/24 1710)  ipratropium-albuterol  (DUONEB) 0.5-2.5 (3) MG/3ML nebulizer solution 3 mL (3 mLs Nebulization Given 02/17/24 1715)  Initial Impression / Assessment and Plan / UC Course  I have reviewed the triage vital signs and the nursing notes.  Pertinent labs & imaging results that were available during my care of the patient were reviewed by me and considered in my medical decision making (see chart for details).     Patient is informed that her x-ray shows no active pulmonary disease. Final Clinical Impressions(s) / UC Diagnoses   Final diagnoses:  Environmental and seasonal allergies  Mild intermittent extrinsic asthma with acute exacerbation  Acute bronchitis, unspecified organism     Discharge Instructions      Drink lots of water Take the prednisone  as directed.  You can start the prednisone  in the morning Take doxycycline  2 times a day.  Take doxycycline  with food.  This is an antibiotic for chest infection I have prescribed Tessalon  for cough.  You may take this 2-3 times a day.  It will help with the bad coughing spells You may take, in addition, Mucinex DM twice a day.  This may help loosen the mucus See your doctor if not improved by next week      ED Prescriptions     Medication Sig Dispense Auth. Provider   doxycycline  (VIBRAMYCIN ) 100 MG capsule Take 1 capsule (100 mg total) by mouth 2 (two) times daily. 20 capsule Stephany Ehrich, MD   predniSONE  (DELTASONE ) 20 MG tablet Take 2 pills a day for 5 days, then 1 pill a day for 5 days, then stop 15 tablet Nicholette Barley Cleola Dach, MD   benzonatate  (TESSALON ) 200 MG capsule Take 1 capsule (200 mg total) by mouth 2 (two) times daily as needed for cough. 20 capsule Stephany Ehrich, MD      I have reviewed the PDMP during this encounter.   Stephany Ehrich, MD 02/17/24 1800

## 2024-02-17 NOTE — ED Triage Notes (Signed)
 Pt c/o cough, congestion x 3 weeks. Hx of seasonal allergies. The dizziness and shortness of breath started a week ago. Worsening in last few days. Denies fever. Did allergy shots in past, but decided to skip them this year. Meclizine and Chlorphen prn.

## 2024-02-17 NOTE — Discharge Instructions (Signed)
 Drink lots of water Take the prednisone  as directed.  You can start the prednisone  in the morning Take doxycycline  2 times a day.  Take doxycycline  with food.  This is an antibiotic for chest infection I have prescribed Tessalon  for cough.  You may take this 2-3 times a day.  It will help with the bad coughing spells You may take, in addition, Mucinex DM twice a day.  This may help loosen the mucus See your doctor if not improved by next week
